# Patient Record
Sex: Female | Born: 2000 | Race: Black or African American | Hispanic: No | Marital: Single | Smoking: Never smoker
Health system: Southern US, Community
[De-identification: ages and names within clinical notes are randomized; demographics above are authoritative.]

---
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
## 2020-12-03 ENCOUNTER — Encounter (HOSPITAL_BASED_OUTPATIENT_CLINIC_OR_DEPARTMENT_OTHER): Payer: Self-pay | Admitting: *Deleted

## 2020-12-03 ENCOUNTER — Emergency Department (HOSPITAL_BASED_OUTPATIENT_CLINIC_OR_DEPARTMENT_OTHER)
Admission: EM | Admit: 2020-12-03 | Discharge: 2020-12-04 | Disposition: A | Discharge status: Home / Self Care | Payer: Medicaid Other | Attending: Emergency Medicine | Admitting: Emergency Medicine

## 2020-12-03 ENCOUNTER — Other Ambulatory Visit: Payer: Self-pay

## 2020-12-03 DIAGNOSIS — R109 Unspecified abdominal pain: Secondary | ICD-10-CM | POA: Diagnosis not present

## 2020-12-03 DIAGNOSIS — N938 Other specified abnormal uterine and vaginal bleeding: Secondary | ICD-10-CM | POA: Insufficient documentation

## 2020-12-03 DIAGNOSIS — Z3201 Encounter for pregnancy test, result positive: Secondary | ICD-10-CM | POA: Insufficient documentation

## 2020-12-03 LAB — URINALYSIS, ROUTINE W REFLEX MICROSCOPIC
Bilirubin Urine: NEGATIVE
Glucose, UA: NEGATIVE mg/dL
Ketones, ur: 15 mg/dL — AB
Nitrite: NEGATIVE
Protein, ur: 100 mg/dL — AB
Specific Gravity, Urine: 1.025 (ref 1.005–1.030)
pH: 5.5 (ref 5.0–8.0)

## 2020-12-03 LAB — URINALYSIS, MICROSCOPIC (REFLEX): RBC / HPF: >50 RBC/hpf (ref 0–5)

## 2020-12-03 LAB — PREGNANCY, URINE: Preg Test, Ur: NEGATIVE

## 2020-12-03 NOTE — ED Triage Notes (Signed)
Vaginal bleeding this am. Prior to the bleeding she has a positive home pregnancy test.

## 2020-12-04 LAB — HCG, QUANTITATIVE, PREGNANCY: hCG, Beta Chain, Quant, S: <1 m[IU]/mL (ref <5)

## 2020-12-04 NOTE — ED Provider Notes (Signed)
[facility] EMERGENCY DEPARTMENT Provider Note   CSN: [id number] Arrival date & time: 12/03/20  2109     History Chief Complaint  Patient presents with  . Vaginal Bleeding    Emma Calhoun is a 20 y.o. female.  HPI     This a 20 year old female who presents with vaginal bleeding.  She reports that her last menstrual period was April 27.  She states she had onset of some vaginal bleeding this morning and has had some persistent spotting.  She states that she took a digital pregnancy test yesterday and today and reports that both were positive.  She reports normal menstrual cycles.  She has had 1 prior pregnancy that ended in miscarriage.  She is not having any significant lateralizing abdominal pain.  She states she has an occasional cramping.  She states that she was feeling nauseous and that is why she took a pregnancy test.  Denies any hematuria or dysuria.  History reviewed. No pertinent past medical history.  There are no problems to display for this patient.   History reviewed. No pertinent surgical history.   OB History   No obstetric history on file.     No family history on file.  Social History   Tobacco Use  . Smoking status: Never Smoker  . Smokeless tobacco: Never Used  Substance Use Topics  . Alcohol use: Never  . Drug use: Never    Home Medications Prior to Admission medications   Not on File    Allergies    Patient has no known allergies.  Review of Systems   Review of Systems  Constitutional: Negative for fever.  Respiratory: Negative for shortness of breath.   Cardiovascular: Negative for chest pain.  Gastrointestinal: Negative for abdominal pain, nausea and vomiting.  Genitourinary: Positive for vaginal bleeding.  Musculoskeletal: Negative for back pain.  Neurological: Negative for weakness.  All other systems reviewed and are negative.   Physical Exam Updated Vital Signs BP (!) 129/92   Pulse 86   Temp 99.8 F (37.7  C) (Oral)   Resp 12   Ht 1.549 m (5\' 1" )   Wt 64 kg   LMP 11/15/2020   SpO2 99%   BMI 26.64 kg/m   Physical Exam Vitals and nursing note reviewed.  Constitutional:      Appearance: She is well-developed. She is not ill-appearing.  HENT:     Head: Normocephalic and atraumatic.     Mouth/Throat:     Mouth: Mucous membranes are moist.  Eyes:     Pupils: Pupils are equal, round, and reactive to light.  Cardiovascular:     Rate and Rhythm: Normal rate and regular rhythm.     Heart sounds: Normal heart sounds.  Pulmonary:     Effort: Pulmonary effort is normal. No respiratory distress.     Breath sounds: No wheezing.  Abdominal:     General: Bowel sounds are normal.     Palpations: Abdomen is soft.     Tenderness: There is no abdominal tenderness. There is no guarding or rebound.  Musculoskeletal:     Cervical back: Neck supple.     Right lower leg: No edema.     Left lower leg: No edema.  Skin:    General: Skin is warm and dry.  Neurological:     Mental Status: She is alert and oriented to person, place, and time.  Psychiatric:        Mood and Affect: Mood normal.  ED Results / Procedures / Treatments   Labs (all labs ordered are listed, but only abnormal results are displayed) Labs Reviewed  URINALYSIS, ROUTINE W REFLEX MICROSCOPIC - Abnormal; Notable for the following components:      Result Value   APPearance CLOUDY (*)    Hgb urine dipstick LARGE (*)    Ketones, ur 15 (*)    Protein, ur 100 (*)    Leukocytes,Ua SMALL (*)    All other components within normal limits  URINALYSIS, MICROSCOPIC (REFLEX) - Abnormal; Notable for the following components:   Bacteria, UA RARE (*)    All other components within normal limits  PREGNANCY, URINE  HCG, QUANTITATIVE, PREGNANCY    EKG None  Radiology No results found.  Procedures Procedures   Medications Ordered in ED Medications - No data to display  ED Course  I have reviewed the triage vital signs and  the nursing notes.  Pertinent labs & imaging results that were available during my care of the patient were reviewed by me and considered in my medical decision making (see chart for details).    MDM Rules/Calculators/A&P                          Patient presents with vaginal bleeding.  She is concerned because she reports a positive pregnancy test at home.  She is overall nontoxic and vital signs are reassuring.  She is afebrile.  She is not having any lateralizing abdominal pain and her abdominal exam is benign.  Considerations include but not limited to, miscarriage, normal pregnancy with first trimester bleeding, ectopic, dysfunctional uterine bleeding.  Urine pregnancy test here is negative.  We will repeat blood pregnancy levels given that sensitivity is higher.  Patient deferred pelvic exam.  Her abdominal exam is benign and she does not have any lateralizing tenderness.  Blood pregnancy test is also negative.  She would have had to have ovulated quite early in her cycle to be pregnant.  I cannot explain her positive pregnancy test at home but clearly she is negative here.  Have low suspicion at this time for ectopic as she is clinically stable without abdominal pain.  Recommend monitoring her symptoms closely.  If she develops recurrent or worsening bleeding or lateralizing abdominal pain, she should be reevaluated.  After history, exam, and medical workup I feel the patient has been appropriately medically screened and is safe for discharge home. Pertinent diagnoses were discussed with the patient. Patient was given return precautions.  Final Clinical Impression(s) / ED Diagnoses Final diagnoses:  Dysfunctional uterine bleeding    Rx / DC Orders ED Discharge Orders    None       Alvia Tory, Mayer Masker, MD 12/04/20 [phone]

## 2020-12-04 NOTE — Discharge Instructions (Addendum)
You were seen today for dysfunctional uterine bleeding.  Both your urine and blood pregnancy tests are negative.  Monitor your symptoms closely.  If you develop bleeding that worsens or 6 through more than 1 pad an hour, you should be reevaluated.  If you develop significant abdominal pain, you should be reevaluated.
